# Patient Record
Sex: Male | Born: 1970 | Race: Black or African American | Hispanic: No | Marital: Single | Smoking: Current every day smoker
Health system: Southern US, Community
[De-identification: ages and names within clinical notes are randomized; demographics above are authoritative.]

## PROBLEM LIST (undated history)

## (undated) DIAGNOSIS — F99 Mental disorder, not otherwise specified: Secondary | ICD-10-CM

## (undated) DIAGNOSIS — F1910 Other psychoactive substance abuse, uncomplicated: Secondary | ICD-10-CM

## (undated) HISTORY — PX: DENTAL SURGERY: SHX609

---

## 2011-04-14 ENCOUNTER — Emergency Department (HOSPITAL_COMMUNITY)
Admission: EM | Admit: 2011-04-14 | Discharge: 2011-04-14 | Disposition: A | Discharge status: Home / Self Care | Payer: Medicare Other | Attending: Emergency Medicine | Admitting: Emergency Medicine

## 2011-04-14 ENCOUNTER — Encounter: Payer: Self-pay | Admitting: *Deleted

## 2011-04-14 DIAGNOSIS — M545 Low back pain, unspecified: Secondary | ICD-10-CM | POA: Insufficient documentation

## 2011-04-14 DIAGNOSIS — L03319 Cellulitis of trunk, unspecified: Secondary | ICD-10-CM | POA: Insufficient documentation

## 2011-04-14 DIAGNOSIS — F17200 Nicotine dependence, unspecified, uncomplicated: Secondary | ICD-10-CM | POA: Insufficient documentation

## 2011-04-14 DIAGNOSIS — Z79899 Other long term (current) drug therapy: Secondary | ICD-10-CM | POA: Insufficient documentation

## 2011-04-14 DIAGNOSIS — L02219 Cutaneous abscess of trunk, unspecified: Secondary | ICD-10-CM | POA: Insufficient documentation

## 2011-04-14 DIAGNOSIS — L0291 Cutaneous abscess, unspecified: Secondary | ICD-10-CM

## 2011-04-14 HISTORY — DX: Other psychoactive substance abuse, uncomplicated: F19.10

## 2011-04-14 HISTORY — DX: Mental disorder, not otherwise specified: F99

## 2011-04-14 NOTE — ED Provider Notes (Signed)
History     CSN: [id number]  Arrival date & time 04/14/11  1435   First MD Initiated Contact with Patient 04/14/11 1753      Chief Complaint  Patient presents with  . Mass    L hip mass    (Consider location/radiation/quality/duration/timing/severity/associated sxs/prior treatment) HPI Comments: Abscess: Patient presents for evaluation of a cutaneous abscess. Lesion is located in the left lower back . Onset was 2 days ago. Symptoms have stabilized. Abscess has associated symptoms of pain. Patient does not have previous history of cutaneous abscesses. Patient does not have diabetes.    Patient is a 41 y.o. male presenting with abscess. The history is provided by the patient.  Abscess  This is a new problem. The current episode started less than one week ago. The problem has been gradually worsening. Affected Location: LL back  The problem is mild. Pertinent negatives include no anorexia, no decrease in physical activity, not sleeping less, not drinking less, no fever, no fussiness, not sleeping more, no diarrhea, no vomiting, no congestion, no rhinorrhea, no sore throat, no decreased responsiveness and no cough. His past medical history does not include skin abscesses in family. There were no sick contacts.    Past Medical History  Diagnosis Date  . Psychiatric illness     Schitzophrenia  . Substance abuse     Past Surgical History  Procedure Date  . Dental surgery     Family History  Problem Relation Age of Onset  . Diabetes Mother   . Hypertension Mother     History  Substance Use Topics  . Smoking status: Current Everyday Smoker -- 0.5 packs/day  . Smokeless tobacco: Never Used  . Alcohol Use: No     Sober for 5 months      Review of Systems  Constitutional: Negative for fever, chills, diaphoresis, activity change and decreased responsiveness.       Denies night sweats  HENT: Negative for congestion, sore throat, rhinorrhea and neck stiffness.   Eyes:  Negative for visual disturbance.  Respiratory: Negative for cough and shortness of breath.   Cardiovascular: Negative for chest pain.  Gastrointestinal: Negative for vomiting, abdominal pain, diarrhea and anorexia.  Genitourinary: Negative for dysuria, urgency and frequency.  Musculoskeletal: Negative for gait problem.  Skin: Negative for color change and rash.  Neurological: Negative for dizziness, light-headedness and headaches.  Hematological: Negative for adenopathy.  All other systems reviewed and are negative.    Allergies  Review of patient's allergies indicates not on file.  Home Medications   Current Outpatient Rx  Name Route Sig Dispense Refill  . BENZTROPINE MESYLATE 1 MG PO TABS Oral Take 1 mg by mouth 2 (two) times daily.      Marland Kitchen CITALOPRAM HYDROBROMIDE 20 MG PO TABS Oral Take 20 mg by mouth daily.      Marland Kitchen FLUPHENAZINE HCL 5 MG PO TABS Oral Take 5 mg by mouth daily.      . TRAZODONE HCL 50 MG PO TABS Oral Take 50 mg by mouth at bedtime.        BP 114/59  Pulse 63  Temp(Src) 98.7 F (37.1 C) (Oral)  Resp 16  Wt 148 lb (67.132 kg)  SpO2 97%  Physical Exam  Nursing note and vitals reviewed. Constitutional: He is oriented to person, place, and time. Vital signs are normal. He appears well-developed and well-nourished. He does not appear ill. No distress.  HENT:  Head: Normocephalic and atraumatic.  Eyes: EOM are normal. Pupils  are equal, round, and reactive to light.  Neck: Normal range of motion. Neck supple.  Cardiovascular: Normal rate and regular rhythm.   Pulmonary/Chest: Effort normal.  Musculoskeletal: He exhibits no edema.  Lymphadenopathy:       Head (right side): No submental, no preauricular and no posterior auricular adenopathy present.       Head (left side): No submental, no submandibular, no preauricular and no posterior auricular adenopathy present.    He has no cervical adenopathy.    He has no axillary adenopathy.  Neurological: He is alert  and oriented to person, place, and time.  Skin: Skin is warm and dry. No rash noted. He is not diaphoretic.          LLBack abscess 4x5cm, fluctuant, mild erythema, no warmth, not currently draining, tender to palpation.    ED Course  Procedures (including critical care time) INCISION AND DRAINAGE Performed by: Jaci Carrel Consent: Verbal consent obtained. Risks and benefits: risks, benefits and alternatives were discussed Type: abscess  Body area: left lower back  Anesthesia: local infiltration  Local anesthetic: lidocaine 1% with  epinephrine  Anesthetic total: 2 ml  Complexity: complex Blunt dissection to break up loculations  Drainage: purulent  Drainage amount: moderate  Packing material: 1/4 in iodoform gauze  Patient tolerance: Patient tolerated the procedure well with no immediate complications.    Labs Reviewed - No data to display No results found.   No diagnosis found.    MDM  Abscess        Jaci Carrel, [place] 04/14/11 1610

## 2011-04-14 NOTE — ED Notes (Signed)
Pt reports "a knot" to L hip/lower back area "from a Prolixin injection", area has been there for about a year but has gotten bigger with increase in pain over the last 2 weeks.

## 2011-04-15 NOTE — ED Provider Notes (Signed)
Medical screening examination/treatment/procedure(s) were performed by non-physician practitioner and as supervising physician I was immediately available for consultation/collaboration.  Tynslee Bowlds P Kalup Jaquith, MD 04/15/11 0024 

## 2013-11-10 ENCOUNTER — Encounter (HOSPITAL_BASED_OUTPATIENT_CLINIC_OR_DEPARTMENT_OTHER): Payer: Self-pay | Admitting: Emergency Medicine

## 2013-11-10 ENCOUNTER — Emergency Department (HOSPITAL_BASED_OUTPATIENT_CLINIC_OR_DEPARTMENT_OTHER)
Admission: EM | Admit: 2013-11-10 | Discharge: 2013-11-10 | Disposition: A | Discharge status: Home / Self Care | Payer: Medicare Other | Attending: Emergency Medicine | Admitting: Emergency Medicine

## 2013-11-10 DIAGNOSIS — Y9389 Activity, other specified: Secondary | ICD-10-CM | POA: Insufficient documentation

## 2013-11-10 DIAGNOSIS — S6992XA Unspecified injury of left wrist, hand and finger(s), initial encounter: Secondary | ICD-10-CM

## 2013-11-10 DIAGNOSIS — Y929 Unspecified place or not applicable: Secondary | ICD-10-CM | POA: Insufficient documentation

## 2013-11-10 DIAGNOSIS — W230XXA Caught, crushed, jammed, or pinched between moving objects, initial encounter: Secondary | ICD-10-CM | POA: Insufficient documentation

## 2013-11-10 DIAGNOSIS — S6980XA Other specified injuries of unspecified wrist, hand and finger(s), initial encounter: Secondary | ICD-10-CM | POA: Insufficient documentation

## 2013-11-10 DIAGNOSIS — S6990XA Unspecified injury of unspecified wrist, hand and finger(s), initial encounter: Secondary | ICD-10-CM | POA: Insufficient documentation

## 2013-11-10 DIAGNOSIS — Z79899 Other long term (current) drug therapy: Secondary | ICD-10-CM | POA: Insufficient documentation

## 2013-11-10 DIAGNOSIS — F17200 Nicotine dependence, unspecified, uncomplicated: Secondary | ICD-10-CM | POA: Insufficient documentation

## 2013-11-10 DIAGNOSIS — Z8659 Personal history of other mental and behavioral disorders: Secondary | ICD-10-CM | POA: Insufficient documentation

## 2013-11-10 MED ORDER — OXYCODONE-ACETAMINOPHEN 5-325 MG PO TABS
1.0000 | ORAL_TABLET | Freq: Once | ORAL | Status: AC
  Administered 2013-11-10: 1 via ORAL
  Filled 2013-11-10: qty 1

## 2013-11-10 MED ORDER — OXYCODONE-ACETAMINOPHEN 5-325 MG PO TABS: 1.0000 | ORAL_TABLET | Freq: Four times a day (QID) | ORAL | Status: DC | PRN

## 2013-11-10 MED ORDER — SULFAMETHOXAZOLE-TMP DS 800-160 MG PO TABS
1.0000 | ORAL_TABLET | Freq: Once | ORAL | Status: AC
Start: 2013-11-10 — End: 2013-11-10
  Administered 2013-11-10: 1 via ORAL
  Filled 2013-11-10: qty 1

## 2013-11-10 MED ORDER — SULFAMETHOXAZOLE-TMP DS 800-160 MG PO TABS: 1.0000 | ORAL_TABLET | Freq: Two times a day (BID) | ORAL | Status: DC

## 2013-11-10 NOTE — Discharge Instructions (Signed)
Return to the ED with any concerns including increased pain or swelling in finger, pus draining, redness of finger, or any other alarming symptoms  You should return to the ED for a recheck in the next 48 hours

## 2013-11-10 NOTE — ED Notes (Addendum)
Pt reports he put a ring on his finger 3 days ago, 2 days ago started having pain/swelling in his finger. Pt reports [facility] went to [facility] and left after being seen this morning and they were unable to get the ring off.

## 2013-11-10 NOTE — ED Notes (Signed)
Contact for patient Scott Jenkins [phone]

## 2013-11-10 NOTE — ED Provider Notes (Signed)
CSN: [id number]     Arrival date & time 11/10/13  1443 History   First MD Initiated Contact with Patient 11/10/13 1516     Chief Complaint  Patient presents with  . Hand Pain     (Consider location/radiation/quality/duration/timing/severity/associated sxs/prior Treatment) HPI Pt presenting with c/o not being able to get rings off his left middle finger.  He states the rings were put on 3 days ago and 2 days ago he began to notice discoloration and swelling of his finger.  He states finger is painful.  He states he was at [facility] earlier today and left before they were able to remove the rings.  Pain is constant.  There are no other associated systemic symptoms, there are no other alleviating or modifying factors.   Past Medical History  Diagnosis Date  . Psychiatric illness     Schitzophrenia  . Substance abuse    Past Surgical History  Procedure Laterality Date  . Dental surgery     Family History  Problem Relation Age of Onset  . Diabetes Mother   . Hypertension Mother    History  Substance Use Topics  . Smoking status: Current Every Day Smoker -- 0.50 packs/day  . Smokeless tobacco: Never Used  . Alcohol Use: Yes     Comment: Sober for 5 months    Review of Systems ROS reviewed and all otherwise negative except for mentioned in HPI    Allergies  Review of patient's allergies indicates not on file.  Home Medications   Prior to Admission medications   Medication Sig Start Date End Date Taking? Authorizing Provider  benztropine (COGENTIN) 1 MG tablet Take 1 mg by mouth 2 (two) times daily.      Historical Provider, MD  citalopram (CELEXA) 20 MG tablet Take 20 mg by mouth daily.      Historical Provider, MD  fluPHENAZine (PROLIXIN) 5 MG tablet Take 5 mg by mouth daily.      Historical Provider, MD  oxyCODONE-acetaminophen (PERCOCET/ROXICET) 5-325 MG per tablet Take 1-2 tablets by mouth every 6 (six) hours as needed for severe pain. 11/10/13   Ethelda ChickMartha K  Linker, MD  sulfamethoxazole-trimethoprim (BACTRIM DS) 800-160 MG per tablet Take 1 tablet by mouth 2 (two) times daily. 11/10/13   Ethelda ChickMartha K Linker, MD  traZODone (DESYREL) 50 MG tablet Take 50 mg by mouth at bedtime.      Historical Provider, MD   BP 141/88  Pulse 54  Temp(Src) 97.9 F (36.6 C) (Oral)  Resp 18  Ht 5\' 8"  (1.727 m)  Wt 142 lb (64.411 kg)  BMI 21.60 kg/m2  SpO2 98% Vitals reviewed Physical Exam Physical Examination: General appearance - alert, well appearing, and in no distress Mental status - alert, oriented to person, place, and time Eyes -  No conjunctival injection, no scleral icterus Chest - clear to auscultation, no wheezes, rales or rhonchi, symmetric air entry Heart - normal rate, regular rhythm, normal S1, S2, no murmurs, rubs, clicks or gallops Musculoskeletal - no joint tenderness, deformity or swelling Extremities - peripheral pulses normal, no pedal edema, no clubbing or cyanosis Skin - left 3rd finger with swelling and erythema distal to multiple thick rings- rings appear embedded in skin with some skin breakdown around ring edges, otherwise normal coloration and turgor, no rashes  ED Course  Procedures (including critical care time) Labs Review Labs Reviewed - No data to display  Imaging Review No results found.   EKG Interpretation None  MDM   Final diagnoses:  Finger injury, left, initial encounter    3 rings stuck on patients left 3rd finger- some skin erosion and breakdown associated. Rings removed with ring cutter afer digital block as patient could not tolerate procedure.  Will place patient on antibiotics to cover for possible infection although no active infection seen at this time.  It appears rings were on longer than the 3 days.  Will have patient followup in 2 days for a recheck.      Ethelda Chick, MD 11/12/13 [phone]

## 2014-04-13 DIAGNOSIS — F209 Schizophrenia, unspecified: Secondary | ICD-10-CM | POA: Diagnosis not present

## 2014-04-13 DIAGNOSIS — F251 Schizoaffective disorder, depressive type: Secondary | ICD-10-CM | POA: Diagnosis not present

## 2014-04-27 DIAGNOSIS — F209 Schizophrenia, unspecified: Secondary | ICD-10-CM | POA: Diagnosis not present

## 2014-04-27 DIAGNOSIS — F251 Schizoaffective disorder, depressive type: Secondary | ICD-10-CM | POA: Diagnosis not present

## 2014-05-11 DIAGNOSIS — F209 Schizophrenia, unspecified: Secondary | ICD-10-CM | POA: Diagnosis not present

## 2014-05-11 DIAGNOSIS — F251 Schizoaffective disorder, depressive type: Secondary | ICD-10-CM | POA: Diagnosis not present

## 2014-05-25 DIAGNOSIS — F251 Schizoaffective disorder, depressive type: Secondary | ICD-10-CM | POA: Diagnosis not present

## 2014-05-25 DIAGNOSIS — F209 Schizophrenia, unspecified: Secondary | ICD-10-CM | POA: Diagnosis not present

## 2014-05-28 DIAGNOSIS — F251 Schizoaffective disorder, depressive type: Secondary | ICD-10-CM | POA: Diagnosis not present

## 2014-05-31 DIAGNOSIS — F251 Schizoaffective disorder, depressive type: Secondary | ICD-10-CM | POA: Diagnosis not present

## 2014-06-08 DIAGNOSIS — F209 Schizophrenia, unspecified: Secondary | ICD-10-CM | POA: Diagnosis not present

## 2014-06-08 DIAGNOSIS — F251 Schizoaffective disorder, depressive type: Secondary | ICD-10-CM | POA: Diagnosis not present

## 2014-06-09 DIAGNOSIS — F251 Schizoaffective disorder, depressive type: Secondary | ICD-10-CM | POA: Diagnosis not present

## 2014-06-15 DIAGNOSIS — F209 Schizophrenia, unspecified: Secondary | ICD-10-CM | POA: Diagnosis not present

## 2014-06-15 DIAGNOSIS — F251 Schizoaffective disorder, depressive type: Secondary | ICD-10-CM | POA: Diagnosis not present

## 2014-06-22 DIAGNOSIS — F209 Schizophrenia, unspecified: Secondary | ICD-10-CM | POA: Diagnosis not present

## 2014-06-22 DIAGNOSIS — F251 Schizoaffective disorder, depressive type: Secondary | ICD-10-CM | POA: Diagnosis not present

## 2014-06-23 DIAGNOSIS — F251 Schizoaffective disorder, depressive type: Secondary | ICD-10-CM | POA: Diagnosis not present

## 2014-07-06 DIAGNOSIS — F251 Schizoaffective disorder, depressive type: Secondary | ICD-10-CM | POA: Diagnosis not present

## 2014-07-06 DIAGNOSIS — F209 Schizophrenia, unspecified: Secondary | ICD-10-CM | POA: Diagnosis not present

## 2014-07-08 DIAGNOSIS — F251 Schizoaffective disorder, depressive type: Secondary | ICD-10-CM | POA: Diagnosis not present

## 2014-07-20 DIAGNOSIS — F251 Schizoaffective disorder, depressive type: Secondary | ICD-10-CM | POA: Diagnosis not present

## 2014-07-20 DIAGNOSIS — F209 Schizophrenia, unspecified: Secondary | ICD-10-CM | POA: Diagnosis not present

## 2014-08-03 DIAGNOSIS — F209 Schizophrenia, unspecified: Secondary | ICD-10-CM | POA: Diagnosis not present

## 2014-08-03 DIAGNOSIS — F251 Schizoaffective disorder, depressive type: Secondary | ICD-10-CM | POA: Diagnosis not present

## 2014-08-17 DIAGNOSIS — F251 Schizoaffective disorder, depressive type: Secondary | ICD-10-CM | POA: Diagnosis not present

## 2014-08-21 ENCOUNTER — Emergency Department (HOSPITAL_COMMUNITY)
Admission: EM | Admit: 2014-08-21 | Discharge: 2014-08-21 | Disposition: A | Discharge status: Home / Self Care | Payer: Medicare Other | Attending: Emergency Medicine | Admitting: Emergency Medicine

## 2014-08-21 ENCOUNTER — Encounter (HOSPITAL_COMMUNITY): Payer: Self-pay | Admitting: *Deleted

## 2014-08-21 ENCOUNTER — Emergency Department (HOSPITAL_COMMUNITY): Payer: Medicare Other

## 2014-08-21 DIAGNOSIS — R0789 Other chest pain: Secondary | ICD-10-CM | POA: Insufficient documentation

## 2014-08-21 DIAGNOSIS — Z8659 Personal history of other mental and behavioral disorders: Secondary | ICD-10-CM | POA: Diagnosis not present

## 2014-08-21 DIAGNOSIS — Z79899 Other long term (current) drug therapy: Secondary | ICD-10-CM | POA: Diagnosis not present

## 2014-08-21 DIAGNOSIS — R079 Chest pain, unspecified: Secondary | ICD-10-CM | POA: Diagnosis not present

## 2014-08-21 DIAGNOSIS — Z720 Tobacco use: Secondary | ICD-10-CM | POA: Insufficient documentation

## 2014-08-21 LAB — I-STAT TROPONIN, ED: TROPONIN I, POC: 0 ng/mL (ref 0.00–0.08)

## 2014-08-21 LAB — CBC WITH DIFFERENTIAL/PLATELET
BASOS ABS: 0.1 10*3/uL (ref 0.0–0.1)
Basophils Relative: 1 % (ref 0–1)
Eosinophils Absolute: 0.2 10*3/uL (ref 0.0–0.7)
Eosinophils Relative: 3 % (ref 0–5)
HCT: 40.1 % (ref 39.0–52.0)
Hemoglobin: 13.1 g/dL (ref 13.0–17.0)
LYMPHS ABS: 2.3 10*3/uL (ref 0.7–4.0)
Lymphocytes Relative: 37 % (ref 12–46)
MCH: 30.5 pg (ref 26.0–34.0)
MCHC: 32.7 g/dL (ref 30.0–36.0)
MCV: 93.5 fL (ref 78.0–100.0)
Monocytes Absolute: 0.7 10*3/uL (ref 0.1–1.0)
Monocytes Relative: 12 % (ref 3–12)
NEUTROS ABS: 3 10*3/uL (ref 1.7–7.7)
NEUTROS PCT: 47 % (ref 43–77)
Platelets: 277 10*3/uL (ref 150–400)
RBC: 4.29 MIL/uL (ref 4.22–5.81)
RDW: 12.5 % (ref 11.5–15.5)
WBC: 6.3 10*3/uL (ref 4.0–10.5)

## 2014-08-21 LAB — I-STAT CHEM 8, ED
BUN: 15 mg/dL (ref 6–20)
CALCIUM ION: 1.25 mmol/L — AB (ref 1.12–1.23)
CREATININE: 0.9 mg/dL (ref 0.61–1.24)
Chloride: 100 mmol/L — ABNORMAL LOW (ref 101–111)
Glucose, Bld: 98 mg/dL (ref 65–99)
HCT: 42 % (ref 39.0–52.0)
HEMOGLOBIN: 14.3 g/dL (ref 13.0–17.0)
Potassium: 4 mmol/L (ref 3.5–5.1)
SODIUM: 141 mmol/L (ref 135–145)
TCO2: 25 mmol/L (ref 0–100)

## 2014-08-21 MED ORDER — NAPROXEN 500 MG PO TABS: 500.0000 mg | ORAL_TABLET | Freq: Two times a day (BID) | ORAL | Status: AC

## 2014-08-21 MED ORDER — METHOCARBAMOL 500 MG PO TABS: 500.0000 mg | ORAL_TABLET | Freq: Two times a day (BID) | ORAL | Status: AC

## 2014-08-21 NOTE — ED Notes (Signed)
Pt reports R sided non radiating chest pain and SOB. Ongoing x2 days. 8/10 pain, increases with movement and deep breathing. Respirations unlabored. Lung sounds clear and equal bilaterally. Skin warm and dry. Pt is alert and oriented x4.

## 2014-08-21 NOTE — Discharge Instructions (Signed)

## 2014-08-21 NOTE — ED Provider Notes (Signed)
CSN: [id number]     Arrival date & time 08/21/14  [id number] History   First MD Initiated Contact with Patient 08/21/14 0820     Chief Complaint  Patient presents with  . Chest Pain     (Consider location/radiation/quality/duration/timing/severity/associated sxs/prior Treatment) HPI   44 year old male with history of schizophrenia and polysubstance abuse who presents for evaluation of chest pain. Patient reports for the past 2-3 days he has had intermittent chest discomfort. He described pain as a sharp sensation to his mid sternum that radiates to his right lower chest. Endorse pleuritic pain worsened with breathing and with certain movement. Pain usually lasting for 20-30 minutes with associated shortness of breath and dizziness. 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 Patient denies fever, chills, severe headache, cough, hemoptysis, abdominal pain, nausea, vomiting, diaphoresis. He denies any recent chest injury or strenuous activities aside from his usual work. Patient mentioned that he has been clean from substance abuse for the past 6 months and denies any cocaine use. Denies any prior history of PE or DVT, no recent surgery, prolonged bed rest, unilateral leg swelling or calf pain. Patient reports his pain is minimal at this time.   Past Medical History  Diagnosis Date  . Psychiatric illness     Schitzophrenia  . Substance abuse    Past Surgical History  Procedure Laterality Date  . Dental surgery     Family History  Problem Relation Age of Onset  . Diabetes Mother   . Hypertension Mother    History  Substance Use Topics  . Smoking status: Current Every Day Smoker -- 0.50 packs/day  . Smokeless tobacco: Never Used  . Alcohol Use: Yes     Comment: Sober for 5 months    Review of Systems  All other systems reviewed and are negative.     Allergies  Review of patient's allergies indicates no known allergies.  Home Medications   Prior to  Admission medications   Medication Sig Start Date End Date Taking? Authorizing Provider  benztropine (COGENTIN) 1 MG tablet Take 1 mg by mouth 2 (two) times daily.      Historical Provider, MD  citalopram (CELEXA) 20 MG tablet Take 20 mg by mouth daily.      Historical Provider, MD  fluPHENAZine (PROLIXIN) 5 MG tablet Take 5 mg by mouth daily.      Historical Provider, MD  oxyCODONE-acetaminophen (PERCOCET/ROXICET) 5-325 MG per tablet Take 1-2 tablets by mouth every 6 (six) hours as needed for severe pain. 11/10/13   Jerelyn ScottMartha Linker, MD  sulfamethoxazole-trimethoprim (BACTRIM DS) 800-160 MG per tablet Take 1 tablet by mouth 2 (two) times daily. 11/10/13   Jerelyn ScottMartha Linker, MD  traZODone (DESYREL) 50 MG tablet Take 50 mg by mouth at bedtime.      Historical Provider, MD   There were no vitals taken for this visit. Physical Exam  Constitutional: He appears well-developed and well-nourished. No distress.  Awake, alert, nontoxic appearance  HENT:  Head: Atraumatic.  Eyes: Conjunctivae are normal. Right eye exhibits no discharge. Left eye exhibits no discharge.  Neck: Normal range of motion. Neck supple.  Cardiovascular: Normal rate, regular rhythm and intact distal pulses.   Pulmonary/Chest: Effort normal. No respiratory distress. He exhibits tenderness (mild tenderness to right anterior lateral chest wall on palpation without overlying skin changes, no crepitus or emphysema.).  Abdominal: Soft. There is no tenderness. There is no rebound.  Musculoskeletal: He exhibits no edema or tenderness.  Neurological: He  is alert.  Skin: Skin is warm and dry. No rash noted.  Psychiatric: He has a normal mood and affect.  Nursing note and vitals reviewed.   ED Course  Procedures (including critical care time)  Patient here with sharp intermittent chest pain. Pain is atypical for ACS. PERC negative low suspicion for PE. 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  10:25 AM HEART score of 1, low risk for MACE.  ECG, CXR, labs are reassuring.  Pt without active CP, only reproducible CP.  Will provide sxs treatment.  outpt f/u recommended.  Return precaution discussed.    Labs Review Labs Reviewed  I-STAT CHEM 8, ED - Abnormal; Notable for the following:    Chloride 100 (*)    Calcium, Ion 1.25 (*)    All other components within normal limits  CBC WITH DIFFERENTIAL/PLATELET  Rosezena SensorI-STAT TROPOININ, ED    Imaging Review Dg Chest 2 View  08/21/2014   CLINICAL DATA:  Acute chest pain.  EXAM: CHEST  2 VIEW  COMPARISON:  None.  FINDINGS: The heart size and mediastinal contours are within normal limits. Both lungs are clear. No pneumothorax or pleural effusion is noted. The visualized skeletal structures are unremarkable.  IMPRESSION: No active cardiopulmonary disease.   Electronically Signed   By: Lupita RaiderJames  Green Jr, M.D.   On: 08/21/2014 10:18     EKG Interpretation None      Date: 08/21/2014  Rate: 65  Rhythm: normal sinus rhythm  QRS Axis: rightward axis  Intervals: normal  ST/T Wave abnormalities: normal  Conduction Disutrbances: none  Narrative Interpretation:   Old EKG Reviewed: No significant changes noted     MDM   Final diagnoses:  Chest pain    BP 128/75 mmHg  Pulse 60  Temp(Src) 97.8 F (36.6 C) (Oral)  Resp 18  SpO2 98%  I have reviewed nursing notes and vital signs. I personally reviewed the imaging tests through PACS system  I reviewed available ER/hospitalization records thought the EMR     Fayrene HelperBowie Gwendoline Judy, PA-C 08/21/14 1034  Fayrene HelperBowie Hosey Burmester, PA-C 08/21/14 1035  Gilda Creasehristopher J Pollina, MD 08/21/14 1036

## 2014-08-21 NOTE — ED Notes (Signed)
Pt reports right side chest pains and sob for several days.

## 2014-08-27 ENCOUNTER — Emergency Department (INDEPENDENT_AMBULATORY_CARE_PROVIDER_SITE_OTHER)
Admission: EM | Admit: 2014-08-27 | Discharge: 2014-08-27 | Disposition: A | Discharge status: Home / Self Care | Payer: Self-pay | Source: Home / Self Care | Attending: Family Medicine | Admitting: Family Medicine

## 2014-08-27 ENCOUNTER — Encounter (HOSPITAL_COMMUNITY): Payer: Self-pay | Admitting: Emergency Medicine

## 2014-08-27 DIAGNOSIS — K088 Other specified disorders of teeth and supporting structures: Secondary | ICD-10-CM

## 2014-08-27 DIAGNOSIS — K047 Periapical abscess without sinus: Secondary | ICD-10-CM

## 2014-08-27 DIAGNOSIS — K0889 Other specified disorders of teeth and supporting structures: Secondary | ICD-10-CM

## 2014-08-27 MED ORDER — DICLOFENAC SODIUM 75 MG PO TBEC: 75.0000 mg | DELAYED_RELEASE_TABLET | Freq: Two times a day (BID) | ORAL | Status: AC | PRN

## 2014-08-27 MED ORDER — AMOXICILLIN 500 MG PO CAPS: 500.0000 mg | ORAL_CAPSULE | Freq: Three times a day (TID) | ORAL | Status: AC

## 2014-08-27 NOTE — ED Notes (Signed)
Pt reports an abscess on his upper front gum that he has had for about two weeks that is getting progressively bigger and more painful.

## 2014-08-27 NOTE — ED Provider Notes (Signed)
Scott Jenkins is a 44 y.o. male who presents to Urgent Care today for tooth pain. Patient has left upper frontal tooth pain present for the last 2 weeks. He denies any injury. He notes pain without fevers chills nausea vomiting or diarrhea. He has not tried anything yet. He has not attempted to contact a dentist yet. [facility]    Past Medical History  Diagnosis Date  . Psychiatric illness     Schitzophrenia  . Substance abuse    Past Surgical History  Procedure Laterality Date  . Dental surgery     History  Substance Use Topics  . Smoking status: Current Every Day Smoker -- 0.50 packs/day  . Smokeless tobacco: Never Used  . Alcohol Use: Yes     Comment: Sober for 5 months   ROS as above Medications: No current facility-administered medications for this encounter.   Current Outpatient Prescriptions  Medication Sig Dispense Refill  . benztropine (COGENTIN) 1 MG tablet Take 1 mg by mouth daily.     . Divalproex Sodium (DEPAKOTE PO) Take 1 tablet by mouth at bedtime.    . fluPHENAZine decanoate (PROLIXIN) 25 MG/ML injection Inject 12.5 mg into the muscle every 14 (fourteen) days.    Marland Kitchen. QUEtiapine Fumarate (SEROQUEL PO) Take by mouth.    Marland Kitchen. amoxicillin (AMOXIL) 500 MG capsule Take 1 capsule (500 mg total) by mouth 3 (three) times daily. 30 capsule 0  . citalopram (CELEXA) 20 MG tablet Take 20 mg by mouth daily.      . diclofenac (VOLTAREN) 75 MG EC tablet Take 1 tablet (75 mg total) by mouth 2 (two) times daily as needed. 30 tablet 0  . methocarbamol (ROBAXIN) 500 MG tablet Take 1 tablet (500 mg total) by mouth 2 (two) times daily. 20 tablet 0  . naproxen (NAPROSYN) 500 MG tablet Take 1 tablet (500 mg total) by mouth 2 (two) times daily. 30 tablet 0   No Known Allergies   Exam:  BP 149/88 mmHg  Pulse 58  Temp(Src) 98.3 F (36.8 C) (Oral)  SpO2 98% Gen: Well NAD HEENT: EOMI,  MMM Tender erythematous area above left frontal incisor. Incisor  slightly loose and eroded at the gumline. No fluctuance.  Lungs: Normal work of breathing. CTABL Heart: RRR no MRG Abd: NABS, Soft. Nondistended, Nontender Exts: Brisk capillary refill, warm and well perfused.   No results found for this or any previous visit (from the past 24 hour(s)). No results found.  Assessment and Plan: 44 y.o. male with dental pain. Likely due to dental infection. Treat with amoxicillin and diclofenac. Follow up with a dentist ASAP.  Discussed warning signs or symptoms. Please see discharge instructions. Patient expresses understanding.     Rodolph BongEvan S Adriane Guglielmo, MD 08/27/14 [phone]

## 2014-08-27 NOTE — Discharge Instructions (Signed)
Thank you for coming in today.  Dental Abscess A dental abscess is a collection of infected fluid (pus) from a bacterial infection in the inner part of the tooth (pulp). It usually occurs at the end of the tooth's root.  CAUSES   Severe tooth decay.  Trauma to the tooth that allows bacteria to enter into the pulp, such as a broken or chipped tooth. SYMPTOMS   Severe pain in and around the infected tooth.  Swelling and redness around the abscessed tooth or in the mouth or face.  Tenderness.  Pus drainage.  Bad breath.  Bitter taste in the mouth.  Difficulty swallowing.  Difficulty opening the mouth.  Nausea.  Vomiting.  Chills.  Swollen neck glands. DIAGNOSIS   A medical and dental history will be taken.  An examination will be performed by tapping on the abscessed tooth.  X-rays may be taken of the tooth to identify the abscess. TREATMENT The goal of treatment is to eliminate the infection. You may be prescribed antibiotic medicine to stop the infection from spreading. A root canal may be performed to save the tooth. If the tooth cannot be saved, it may be pulled (extracted) and the abscess may be drained.  HOME CARE INSTRUCTIONS  Only take over-the-counter or prescription medicines for pain, fever, or discomfort as directed by your caregiver.  Rinse your mouth (gargle) often with salt water ( tsp salt in 8 oz [250 ml] of warm water) to relieve pain or swelling.  Do not drive after taking pain medicine (narcotics).  Do not apply heat to the outside of your face.  Return to your dentist for further treatment as directed. SEEK MEDICAL CARE IF:  Your pain is not helped by medicine.  Your pain is getting worse instead of better. SEEK IMMEDIATE MEDICAL CARE IF:  You have a fever or persistent symptoms for more than 2-3 days.  You have a fever and your symptoms suddenly get worse.  You have chills or a very bad headache.  You have problems breathing or  swallowing.  You have trouble opening your mouth.  You have swelling in the neck or around the eye. Document Released: 03/26/2005 Document Revised: 12/19/2011 Document Reviewed: 07/04/2010 [facility] Patient Information 2015 [employer] [place]. This information is not intended to replace advice given to you by your health care provider. Make sure you discuss any questions you have with your health care provider.  [facility]:  [facility] [phone]- [phone] (ext [phone])  [phone]  Please call Dr. Lawrence Marseillesivils office [phone] or cell [phone] [address], [place]  Cost for tooth removal $200 includes exam, Xray, and extraction and follow up visit.  Bring list of current medications with you.   [facility] - [phone]  [facility] [phone]- [phone]  [address]  [facility]  [address], [place], [place], [address]  [phone], Ext. [phone]  2nd and 4th Thursday of the month at 6:30am (Simple extractions only - no wisdom teeth or surgery) First come/First serve -First 10 clients served  [facility] [place] residents only)  [address], [place], [place], [address]  336 [phone]  [facility]  336 [phone]  [facility]  [phone]  [facility]  [phone]  Please call [facility] at [phone] to get the details to get your tooth pulled.

## 2014-09-01 DIAGNOSIS — F251 Schizoaffective disorder, depressive type: Secondary | ICD-10-CM | POA: Diagnosis not present

## 2014-09-01 DIAGNOSIS — F209 Schizophrenia, unspecified: Secondary | ICD-10-CM | POA: Diagnosis not present

## 2014-09-13 DIAGNOSIS — F209 Schizophrenia, unspecified: Secondary | ICD-10-CM | POA: Diagnosis not present

## 2014-09-13 DIAGNOSIS — F251 Schizoaffective disorder, depressive type: Secondary | ICD-10-CM | POA: Diagnosis not present

## 2014-09-14 DIAGNOSIS — F251 Schizoaffective disorder, depressive type: Secondary | ICD-10-CM | POA: Diagnosis not present

## 2014-09-14 DIAGNOSIS — F209 Schizophrenia, unspecified: Secondary | ICD-10-CM | POA: Diagnosis not present

## 2014-09-28 DIAGNOSIS — F251 Schizoaffective disorder, depressive type: Secondary | ICD-10-CM | POA: Diagnosis not present

## 2014-09-28 DIAGNOSIS — F209 Schizophrenia, unspecified: Secondary | ICD-10-CM | POA: Diagnosis not present

## 2014-10-12 DIAGNOSIS — F251 Schizoaffective disorder, depressive type: Secondary | ICD-10-CM | POA: Diagnosis not present

## 2014-10-12 DIAGNOSIS — F209 Schizophrenia, unspecified: Secondary | ICD-10-CM | POA: Diagnosis not present

## 2014-10-19 DIAGNOSIS — F251 Schizoaffective disorder, depressive type: Secondary | ICD-10-CM | POA: Diagnosis not present

## 2014-10-29 ENCOUNTER — Emergency Department (INDEPENDENT_AMBULATORY_CARE_PROVIDER_SITE_OTHER)
Admission: EM | Admit: 2014-10-29 | Discharge: 2014-10-29 | Disposition: A | Discharge status: Home / Self Care | Payer: Self-pay | Source: Home / Self Care | Attending: Family Medicine | Admitting: Family Medicine

## 2014-10-29 ENCOUNTER — Encounter (HOSPITAL_COMMUNITY): Payer: Self-pay | Admitting: Emergency Medicine

## 2014-10-29 DIAGNOSIS — F69 Unspecified disorder of adult personality and behavior: Secondary | ICD-10-CM

## 2014-10-29 DIAGNOSIS — T887XXA Unspecified adverse effect of drug or medicament, initial encounter: Secondary | ICD-10-CM

## 2014-10-29 DIAGNOSIS — F99 Mental disorder, not otherwise specified: Secondary | ICD-10-CM

## 2014-10-29 DIAGNOSIS — T50905A Adverse effect of unspecified drugs, medicaments and biological substances, initial encounter: Secondary | ICD-10-CM

## 2014-10-29 NOTE — ED Notes (Signed)
Patient concerned for feeling like he is shaking, reports uncontrollable twitching.  Patient reports he did not take depakote or seraquel  For 2 days, just too tired, not out of medication.  Patient also reports prolixen was every 2 weeks, but now every 3 weeks.

## 2014-10-29 NOTE — Discharge Instructions (Signed)
Continue to take your medications as directed. Keep appointment with your doctor on Tuesday , 4 days. For worsening go to the [facility] ED promptly.

## 2014-10-29 NOTE — ED Provider Notes (Signed)
CSN: [id number]     Arrival date & time 10/29/14  1306 History   First MD Initiated Contact with Patient 10/29/14 1333     Chief Complaint  Patient presents with  . Medication Reaction   (Consider location/radiation/quality/duration/timing/severity/associated sxs/prior Treatment) HPI Comments: 44 year old male with a history of psychiatric illness to include schizophrenia and substance abuse presents to the urgent care stating he awoke this morning feeling shaky particularly in the extremities he also feels as though his flash get shaky his arms and legs. He denies being nervous or anxious. He denies being depressed. Scar medications include Depakote, Prolixin, Seroquel, citalopram, and Cogentin. He states that approximately 2-3 weeks ago his behavior list physician changed the frequency of his Prolixin from every 2 weeks to every 3 weeks. He is currently due to have an injection in 4 days. He has an appointment with Dr. Stephan Minister in 2 days.   Past Medical History  Diagnosis Date  . Psychiatric illness     Schitzophrenia  . Substance abuse    Past Surgical History  Procedure Laterality Date  . Dental surgery     Family History  Problem Relation Age of Onset  . Diabetes Mother   . Hypertension Mother    History  Substance Use Topics  . Smoking status: Current Every Day Smoker -- 0.50 packs/day  . Smokeless tobacco: Never Used  . Alcohol Use: Yes     Comment: Sober for 5 months    Review of Systems  Constitutional: Positive for fever and fatigue. Negative for activity change.  HENT: Negative.   Respiratory: Negative.  Negative for cough, choking, chest tightness and shortness of breath.   Cardiovascular: Negative for chest pain, palpitations and leg swelling.  Gastrointestinal: Negative.   Neurological: Positive for tremors. Negative for dizziness, seizures, syncope, facial asymmetry, speech difficulty, weakness, light-headedness, numbness and headaches.   Psychiatric/Behavioral: Negative for suicidal ideas, behavioral problems, self-injury and dysphoric mood. The patient is not nervous/anxious.     Allergies  Review of patient's allergies indicates no known allergies.  Home Medications   Prior to Admission medications   Medication Sig Start Date End Date Taking? Authorizing Provider  amoxicillin (AMOXIL) 500 MG capsule Take 1 capsule (500 mg total) by mouth 3 (three) times daily. 08/27/14   Rodolph Bong, MD  benztropine (COGENTIN) 1 MG tablet Take 1 mg by mouth daily.     Historical Provider, MD  citalopram (CELEXA) 20 MG tablet Take 20 mg by mouth daily.      Historical Provider, MD  diclofenac (VOLTAREN) 75 MG EC tablet Take 1 tablet (75 mg total) by mouth 2 (two) times daily as needed. 08/27/14   Rodolph Bong, MD  Divalproex Sodium (DEPAKOTE PO) Take 1 tablet by mouth at bedtime.    Historical Provider, MD  fluPHENAZine decanoate (PROLIXIN) 25 MG/ML injection Inject 12.5 mg into the muscle every 14 (fourteen) days.    Historical Provider, MD  methocarbamol (ROBAXIN) 500 MG tablet Take 1 tablet (500 mg total) by mouth 2 (two) times daily. 08/21/14   Fayrene Helper, PA-C  naproxen (NAPROSYN) 500 MG tablet Take 1 tablet (500 mg total) by mouth 2 (two) times daily. 08/21/14   Fayrene Helper, PA-C  QUEtiapine Fumarate (SEROQUEL PO) Take by mouth.    Historical Provider, MD   BP 122/76 mmHg  Pulse 61  Temp(Src) 98 F (36.7 C) (Oral)  Resp 16  SpO2 95% Physical Exam  Constitutional: He appears well-developed and well-nourished. No distress.  Pleasant 44 year old  male sitting on the exam table with an appearance of relaxed posturing. There are no evidence of tremors or psychomotor agitation. His speech is lucid and goal oriented. He denies auditory or visual hallucinations.  Eyes: Conjunctivae and EOM are normal.  Neck: Normal range of motion. Neck supple.  Cardiovascular: Normal rate, regular rhythm and normal heart sounds.   Pulmonary/Chest: Effort  normal and breath sounds normal. No respiratory distress. He has no wheezes.  Musculoskeletal: Normal range of motion. He exhibits no edema.  Lymphadenopathy:    He has no cervical adenopathy.  Neurological: He exhibits normal muscle tone.  Skin: Skin is warm and dry.  Psychiatric: He has a normal mood and affect. His speech is normal and behavior is normal. Thought content normal. His mood appears not anxious. He is not agitated, not aggressive, not hyperactive, not withdrawn, not actively hallucinating and not combative. Thought content is not delusional. Cognition and memory are not impaired. He expresses no homicidal and no suicidal ideation. He expresses no suicidal plans and no homicidal plans. He is attentive.  Nursing note and vitals reviewed.   ED Course  Procedures (including critical care time) Labs Review Labs Reviewed - No data to display  Imaging Review No results found.   MDM   1. Reaction, drug, adverse, initial encounter   2. Psychological disorder    Keep appt with your psych MD in 4 d Reassurance Pt is stable at this time No signs of withdrawal although there may be consideration of discontinuation syndrome since he missed a couple of days of his psychotropics.     Hayden Rasmussen, NP 10/29/14 1430  Hayden Rasmussen, NP 10/29/14 1430  Hayden Rasmussen, NP 10/29/14 1431

## 2014-11-02 DIAGNOSIS — F209 Schizophrenia, unspecified: Secondary | ICD-10-CM | POA: Diagnosis not present

## 2014-11-02 DIAGNOSIS — F251 Schizoaffective disorder, depressive type: Secondary | ICD-10-CM | POA: Diagnosis not present

## 2014-11-24 DIAGNOSIS — F251 Schizoaffective disorder, depressive type: Secondary | ICD-10-CM | POA: Diagnosis not present

## 2014-11-24 DIAGNOSIS — F209 Schizophrenia, unspecified: Secondary | ICD-10-CM | POA: Diagnosis not present

## 2014-12-15 DIAGNOSIS — F209 Schizophrenia, unspecified: Secondary | ICD-10-CM | POA: Diagnosis not present

## 2014-12-15 DIAGNOSIS — F251 Schizoaffective disorder, depressive type: Secondary | ICD-10-CM | POA: Diagnosis not present

## 2014-12-16 DIAGNOSIS — F209 Schizophrenia, unspecified: Secondary | ICD-10-CM | POA: Diagnosis not present

## 2014-12-16 DIAGNOSIS — F251 Schizoaffective disorder, depressive type: Secondary | ICD-10-CM | POA: Diagnosis not present

## 2014-12-30 DIAGNOSIS — F209 Schizophrenia, unspecified: Secondary | ICD-10-CM | POA: Diagnosis not present

## 2014-12-30 DIAGNOSIS — F251 Schizoaffective disorder, depressive type: Secondary | ICD-10-CM | POA: Diagnosis not present

## 2015-01-13 DIAGNOSIS — F209 Schizophrenia, unspecified: Secondary | ICD-10-CM | POA: Diagnosis not present

## 2015-01-13 DIAGNOSIS — F251 Schizoaffective disorder, depressive type: Secondary | ICD-10-CM | POA: Diagnosis not present

## 2015-01-26 DIAGNOSIS — F251 Schizoaffective disorder, depressive type: Secondary | ICD-10-CM | POA: Diagnosis not present

## 2015-01-27 DIAGNOSIS — F209 Schizophrenia, unspecified: Secondary | ICD-10-CM | POA: Diagnosis not present

## 2015-01-27 DIAGNOSIS — F251 Schizoaffective disorder, depressive type: Secondary | ICD-10-CM | POA: Diagnosis not present

## 2015-02-10 DIAGNOSIS — F209 Schizophrenia, unspecified: Secondary | ICD-10-CM | POA: Diagnosis not present

## 2015-02-10 DIAGNOSIS — F251 Schizoaffective disorder, depressive type: Secondary | ICD-10-CM | POA: Diagnosis not present

## 2015-02-24 DIAGNOSIS — F251 Schizoaffective disorder, depressive type: Secondary | ICD-10-CM | POA: Diagnosis not present

## 2015-02-24 DIAGNOSIS — F209 Schizophrenia, unspecified: Secondary | ICD-10-CM | POA: Diagnosis not present

## 2015-05-11 DIAGNOSIS — F251 Schizoaffective disorder, depressive type: Secondary | ICD-10-CM | POA: Diagnosis not present

## 2015-05-11 DIAGNOSIS — F209 Schizophrenia, unspecified: Secondary | ICD-10-CM | POA: Diagnosis not present

## 2015-05-25 DIAGNOSIS — F209 Schizophrenia, unspecified: Secondary | ICD-10-CM | POA: Diagnosis not present

## 2015-05-25 DIAGNOSIS — F251 Schizoaffective disorder, depressive type: Secondary | ICD-10-CM | POA: Diagnosis not present

## 2015-06-09 DIAGNOSIS — F251 Schizoaffective disorder, depressive type: Secondary | ICD-10-CM | POA: Diagnosis not present

## 2015-06-09 DIAGNOSIS — F209 Schizophrenia, unspecified: Secondary | ICD-10-CM | POA: Diagnosis not present

## 2015-08-16 DIAGNOSIS — R45851 Suicidal ideations: Secondary | ICD-10-CM | POA: Diagnosis not present

## 2015-08-16 DIAGNOSIS — R258 Other abnormal involuntary movements: Secondary | ICD-10-CM | POA: Diagnosis not present

## 2015-08-16 DIAGNOSIS — F29 Unspecified psychosis not due to a substance or known physiological condition: Secondary | ICD-10-CM | POA: Diagnosis not present

## 2015-08-24 DIAGNOSIS — L03115 Cellulitis of right lower limb: Secondary | ICD-10-CM | POA: Diagnosis not present

## 2015-08-26 DIAGNOSIS — F209 Schizophrenia, unspecified: Secondary | ICD-10-CM | POA: Diagnosis not present

## 2015-08-26 DIAGNOSIS — F17210 Nicotine dependence, cigarettes, uncomplicated: Secondary | ICD-10-CM | POA: Diagnosis not present

## 2015-08-26 DIAGNOSIS — Z885 Allergy status to narcotic agent status: Secondary | ICD-10-CM | POA: Diagnosis not present

## 2015-08-26 DIAGNOSIS — L03115 Cellulitis of right lower limb: Secondary | ICD-10-CM | POA: Diagnosis not present

## 2016-01-08 DIAGNOSIS — R220 Localized swelling, mass and lump, head: Secondary | ICD-10-CM | POA: Diagnosis not present

## 2016-01-08 DIAGNOSIS — S0266XA Fracture of symphysis of mandible, initial encounter for closed fracture: Secondary | ICD-10-CM | POA: Diagnosis not present

## 2016-01-08 DIAGNOSIS — S0269XA Fracture of mandible of other specified site, initial encounter for closed fracture: Secondary | ICD-10-CM | POA: Diagnosis not present

## 2016-01-10 DIAGNOSIS — R0602 Shortness of breath: Secondary | ICD-10-CM | POA: Diagnosis not present

## 2016-01-11 DIAGNOSIS — S02609A Fracture of mandible, unspecified, initial encounter for closed fracture: Secondary | ICD-10-CM | POA: Diagnosis not present

## 2016-01-11 DIAGNOSIS — S0266XA Fracture of symphysis of mandible, initial encounter for closed fracture: Secondary | ICD-10-CM | POA: Diagnosis not present

## 2016-01-11 DIAGNOSIS — S02602B Fracture of unspecified part of body of left mandible, initial encounter for open fracture: Secondary | ICD-10-CM | POA: Diagnosis not present

## 2016-01-11 DIAGNOSIS — S02601B Fracture of unspecified part of body of right mandible, initial encounter for open fracture: Secondary | ICD-10-CM | POA: Diagnosis not present

## 2016-01-12 DIAGNOSIS — R509 Fever, unspecified: Secondary | ICD-10-CM | POA: Diagnosis not present

## 2016-01-12 DIAGNOSIS — S02609A Fracture of mandible, unspecified, initial encounter for closed fracture: Secondary | ICD-10-CM | POA: Diagnosis not present

## 2016-01-23 DIAGNOSIS — S02602D Fracture of unspecified part of body of left mandible, subsequent encounter for fracture with routine healing: Secondary | ICD-10-CM | POA: Diagnosis not present

## 2016-01-28 DIAGNOSIS — F209 Schizophrenia, unspecified: Secondary | ICD-10-CM | POA: Diagnosis not present

## 2016-01-29 DIAGNOSIS — F209 Schizophrenia, unspecified: Secondary | ICD-10-CM | POA: Diagnosis not present

## 2016-01-30 DIAGNOSIS — F200 Paranoid schizophrenia: Secondary | ICD-10-CM | POA: Diagnosis not present

## 2016-03-27 DIAGNOSIS — F29 Unspecified psychosis not due to a substance or known physiological condition: Secondary | ICD-10-CM | POA: Diagnosis not present

## 2016-04-04 DIAGNOSIS — F250 Schizoaffective disorder, bipolar type: Secondary | ICD-10-CM | POA: Diagnosis not present

## 2016-04-04 DIAGNOSIS — F29 Unspecified psychosis not due to a substance or known physiological condition: Secondary | ICD-10-CM | POA: Diagnosis not present

## 2016-04-10 DIAGNOSIS — M545 Low back pain: Secondary | ICD-10-CM | POA: Diagnosis not present

## 2016-04-10 DIAGNOSIS — T184XXA Foreign body in colon, initial encounter: Secondary | ICD-10-CM | POA: Diagnosis not present

## 2016-04-13 DIAGNOSIS — F29 Unspecified psychosis not due to a substance or known physiological condition: Secondary | ICD-10-CM | POA: Diagnosis not present

## 2016-04-15 DIAGNOSIS — R1031 Right lower quadrant pain: Secondary | ICD-10-CM | POA: Diagnosis not present

## 2016-04-15 DIAGNOSIS — M25561 Pain in right knee: Secondary | ICD-10-CM | POA: Diagnosis not present

## 2016-04-15 DIAGNOSIS — R319 Hematuria, unspecified: Secondary | ICD-10-CM | POA: Diagnosis not present

## 2016-04-15 DIAGNOSIS — R110 Nausea: Secondary | ICD-10-CM | POA: Diagnosis not present

## 2016-04-15 DIAGNOSIS — M5432 Sciatica, left side: Secondary | ICD-10-CM | POA: Diagnosis not present

## 2016-04-15 DIAGNOSIS — M25562 Pain in left knee: Secondary | ICD-10-CM | POA: Diagnosis not present

## 2016-04-15 DIAGNOSIS — J439 Emphysema, unspecified: Secondary | ICD-10-CM | POA: Diagnosis not present

## 2016-04-27 DIAGNOSIS — F29 Unspecified psychosis not due to a substance or known physiological condition: Secondary | ICD-10-CM | POA: Diagnosis not present

## 2016-04-27 DIAGNOSIS — F250 Schizoaffective disorder, bipolar type: Secondary | ICD-10-CM | POA: Diagnosis not present

## 2016-05-02 DIAGNOSIS — F250 Schizoaffective disorder, bipolar type: Secondary | ICD-10-CM | POA: Diagnosis not present

## 2016-05-02 DIAGNOSIS — F29 Unspecified psychosis not due to a substance or known physiological condition: Secondary | ICD-10-CM | POA: Diagnosis not present

## 2016-05-02 DIAGNOSIS — Z79899 Other long term (current) drug therapy: Secondary | ICD-10-CM | POA: Diagnosis not present

## 2016-05-11 DIAGNOSIS — F29 Unspecified psychosis not due to a substance or known physiological condition: Secondary | ICD-10-CM | POA: Diagnosis not present

## 2016-05-11 DIAGNOSIS — F250 Schizoaffective disorder, bipolar type: Secondary | ICD-10-CM | POA: Diagnosis not present

## 2016-05-25 DIAGNOSIS — F250 Schizoaffective disorder, bipolar type: Secondary | ICD-10-CM | POA: Diagnosis not present

## 2016-05-25 DIAGNOSIS — F29 Unspecified psychosis not due to a substance or known physiological condition: Secondary | ICD-10-CM | POA: Diagnosis not present

## 2016-06-08 DIAGNOSIS — F29 Unspecified psychosis not due to a substance or known physiological condition: Secondary | ICD-10-CM | POA: Diagnosis not present

## 2016-06-08 DIAGNOSIS — F250 Schizoaffective disorder, bipolar type: Secondary | ICD-10-CM | POA: Diagnosis not present

## 2016-06-29 DIAGNOSIS — F250 Schizoaffective disorder, bipolar type: Secondary | ICD-10-CM | POA: Diagnosis not present

## 2016-08-28 DIAGNOSIS — T620X1A Toxic effect of ingested mushrooms, accidental (unintentional), initial encounter: Secondary | ICD-10-CM | POA: Diagnosis not present

## 2016-08-31 DIAGNOSIS — F250 Schizoaffective disorder, bipolar type: Secondary | ICD-10-CM | POA: Diagnosis not present

## 2016-08-31 DIAGNOSIS — Z79899 Other long term (current) drug therapy: Secondary | ICD-10-CM | POA: Diagnosis not present

## 2016-09-11 DIAGNOSIS — T50902A Poisoning by unspecified drugs, medicaments and biological substances, intentional self-harm, initial encounter: Secondary | ICD-10-CM | POA: Diagnosis not present

## 2016-09-11 DIAGNOSIS — R45851 Suicidal ideations: Secondary | ICD-10-CM | POA: Diagnosis not present

## 2016-09-11 DIAGNOSIS — R001 Bradycardia, unspecified: Secondary | ICD-10-CM | POA: Diagnosis not present

## 2016-09-11 DIAGNOSIS — Z915 Personal history of self-harm: Secondary | ICD-10-CM | POA: Diagnosis not present

## 2016-09-11 DIAGNOSIS — F209 Schizophrenia, unspecified: Secondary | ICD-10-CM | POA: Diagnosis not present

## 2016-09-11 DIAGNOSIS — T443X2A Poisoning by other parasympatholytics [anticholinergics and antimuscarinics] and spasmolytics, intentional self-harm, initial encounter: Secondary | ICD-10-CM | POA: Diagnosis not present

## 2016-09-26 DIAGNOSIS — F250 Schizoaffective disorder, bipolar type: Secondary | ICD-10-CM | POA: Diagnosis not present

## 2016-10-05 DIAGNOSIS — F250 Schizoaffective disorder, bipolar type: Secondary | ICD-10-CM | POA: Diagnosis not present

## 2016-11-16 DIAGNOSIS — F250 Schizoaffective disorder, bipolar type: Secondary | ICD-10-CM | POA: Diagnosis not present

## 2017-01-07 DIAGNOSIS — F250 Schizoaffective disorder, bipolar type: Secondary | ICD-10-CM | POA: Diagnosis not present

## 2017-02-01 DIAGNOSIS — F250 Schizoaffective disorder, bipolar type: Secondary | ICD-10-CM | POA: Diagnosis not present

## 2017-02-06 DIAGNOSIS — F250 Schizoaffective disorder, bipolar type: Secondary | ICD-10-CM | POA: Diagnosis not present

## 2017-02-08 DIAGNOSIS — F250 Schizoaffective disorder, bipolar type: Secondary | ICD-10-CM | POA: Diagnosis not present

## 2017-02-08 DIAGNOSIS — E162 Hypoglycemia, unspecified: Secondary | ICD-10-CM | POA: Diagnosis not present

## 2017-02-23 DIAGNOSIS — F200 Paranoid schizophrenia: Secondary | ICD-10-CM | POA: Diagnosis not present

## 2017-02-25 DIAGNOSIS — Z720 Tobacco use: Secondary | ICD-10-CM | POA: Diagnosis not present

## 2017-02-25 DIAGNOSIS — F319 Bipolar disorder, unspecified: Secondary | ICD-10-CM | POA: Diagnosis not present

## 2017-02-25 DIAGNOSIS — R45851 Suicidal ideations: Secondary | ICD-10-CM | POA: Diagnosis not present

## 2017-02-25 DIAGNOSIS — F329 Major depressive disorder, single episode, unspecified: Secondary | ICD-10-CM | POA: Diagnosis not present

## 2017-02-25 DIAGNOSIS — R440 Auditory hallucinations: Secondary | ICD-10-CM | POA: Diagnosis not present

## 2017-02-25 DIAGNOSIS — R32 Unspecified urinary incontinence: Secondary | ICD-10-CM | POA: Diagnosis not present

## 2017-02-26 DIAGNOSIS — F209 Schizophrenia, unspecified: Secondary | ICD-10-CM | POA: Diagnosis not present

## 2017-02-26 DIAGNOSIS — R45851 Suicidal ideations: Secondary | ICD-10-CM | POA: Diagnosis not present

## 2017-02-26 DIAGNOSIS — R32 Unspecified urinary incontinence: Secondary | ICD-10-CM | POA: Diagnosis not present

## 2017-02-26 DIAGNOSIS — Z720 Tobacco use: Secondary | ICD-10-CM | POA: Diagnosis not present

## 2017-02-27 DIAGNOSIS — R45850 Homicidal ideations: Secondary | ICD-10-CM | POA: Diagnosis not present

## 2017-02-27 DIAGNOSIS — R45851 Suicidal ideations: Secondary | ICD-10-CM | POA: Diagnosis not present

## 2017-02-27 DIAGNOSIS — F209 Schizophrenia, unspecified: Secondary | ICD-10-CM | POA: Diagnosis not present

## 2017-02-27 DIAGNOSIS — R32 Unspecified urinary incontinence: Secondary | ICD-10-CM | POA: Diagnosis not present

## 2017-02-28 DIAGNOSIS — R45851 Suicidal ideations: Secondary | ICD-10-CM | POA: Diagnosis not present

## 2017-02-28 DIAGNOSIS — F209 Schizophrenia, unspecified: Secondary | ICD-10-CM | POA: Diagnosis not present

## 2017-02-28 DIAGNOSIS — R45850 Homicidal ideations: Secondary | ICD-10-CM | POA: Diagnosis not present

## 2017-02-28 DIAGNOSIS — R32 Unspecified urinary incontinence: Secondary | ICD-10-CM | POA: Diagnosis not present

## 2017-03-01 DIAGNOSIS — R32 Unspecified urinary incontinence: Secondary | ICD-10-CM | POA: Diagnosis not present

## 2017-03-01 DIAGNOSIS — R45851 Suicidal ideations: Secondary | ICD-10-CM | POA: Diagnosis not present

## 2017-03-01 DIAGNOSIS — R45850 Homicidal ideations: Secondary | ICD-10-CM | POA: Diagnosis not present

## 2017-03-01 DIAGNOSIS — F209 Schizophrenia, unspecified: Secondary | ICD-10-CM | POA: Diagnosis not present

## 2017-03-02 DIAGNOSIS — R32 Unspecified urinary incontinence: Secondary | ICD-10-CM | POA: Diagnosis not present

## 2017-03-02 DIAGNOSIS — F209 Schizophrenia, unspecified: Secondary | ICD-10-CM | POA: Diagnosis not present

## 2017-03-02 DIAGNOSIS — Z6822 Body mass index (BMI) 22.0-22.9, adult: Secondary | ICD-10-CM | POA: Diagnosis not present

## 2017-03-02 DIAGNOSIS — Z720 Tobacco use: Secondary | ICD-10-CM | POA: Diagnosis not present

## 2017-03-03 DIAGNOSIS — R440 Auditory hallucinations: Secondary | ICD-10-CM | POA: Diagnosis not present

## 2017-03-03 DIAGNOSIS — R32 Unspecified urinary incontinence: Secondary | ICD-10-CM | POA: Diagnosis not present

## 2017-03-03 DIAGNOSIS — Z6822 Body mass index (BMI) 22.0-22.9, adult: Secondary | ICD-10-CM | POA: Diagnosis not present

## 2017-03-03 DIAGNOSIS — F209 Schizophrenia, unspecified: Secondary | ICD-10-CM | POA: Diagnosis not present

## 2017-03-04 DIAGNOSIS — Z720 Tobacco use: Secondary | ICD-10-CM | POA: Diagnosis not present

## 2017-03-04 DIAGNOSIS — F209 Schizophrenia, unspecified: Secondary | ICD-10-CM | POA: Diagnosis not present

## 2017-03-04 DIAGNOSIS — Z6822 Body mass index (BMI) 22.0-22.9, adult: Secondary | ICD-10-CM | POA: Diagnosis not present

## 2017-03-04 DIAGNOSIS — R32 Unspecified urinary incontinence: Secondary | ICD-10-CM | POA: Diagnosis not present

## 2017-03-05 DIAGNOSIS — R45851 Suicidal ideations: Secondary | ICD-10-CM | POA: Diagnosis not present

## 2017-03-05 DIAGNOSIS — Z590 Homelessness: Secondary | ICD-10-CM | POA: Diagnosis not present

## 2017-03-05 DIAGNOSIS — R32 Unspecified urinary incontinence: Secondary | ICD-10-CM | POA: Diagnosis not present

## 2017-03-05 DIAGNOSIS — Z720 Tobacco use: Secondary | ICD-10-CM | POA: Diagnosis not present

## 2017-03-11 DIAGNOSIS — F250 Schizoaffective disorder, bipolar type: Secondary | ICD-10-CM | POA: Diagnosis not present

## 2017-05-07 DIAGNOSIS — K047 Periapical abscess without sinus: Secondary | ICD-10-CM | POA: Diagnosis not present

## 2017-05-07 DIAGNOSIS — R6884 Jaw pain: Secondary | ICD-10-CM | POA: Diagnosis not present

## 2017-05-08 DIAGNOSIS — R6884 Jaw pain: Secondary | ICD-10-CM | POA: Diagnosis not present

## 2017-05-08 DIAGNOSIS — K047 Periapical abscess without sinus: Secondary | ICD-10-CM | POA: Diagnosis not present

## 2017-05-11 IMAGING — DX DG CHEST 2V
2 series · 2 of 2 positions shown · non-contrast
Comparison: None.

CLINICAL DATA: Acute chest pain.

EXAM:
CHEST  2 VIEW

[chest pa]
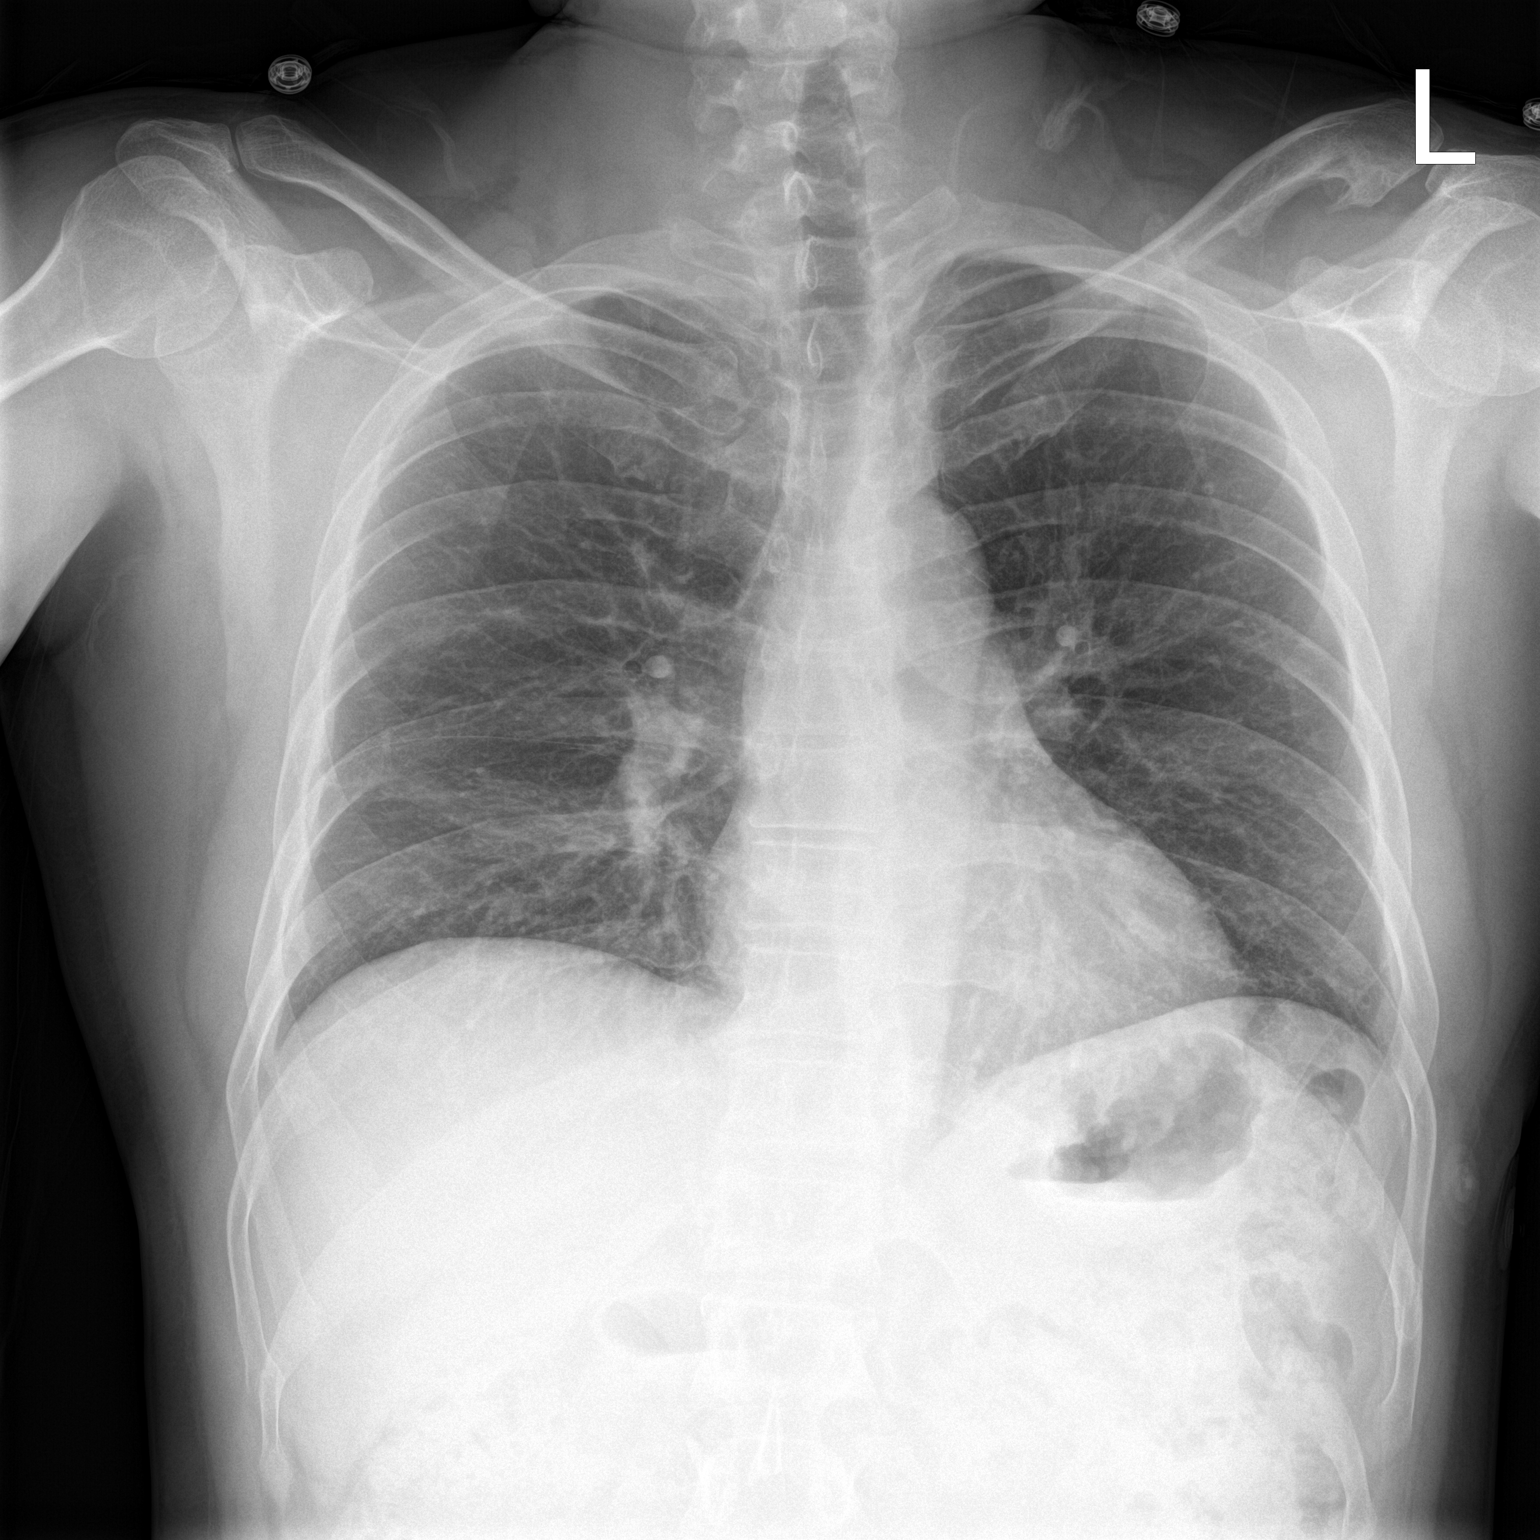

[chest lat]
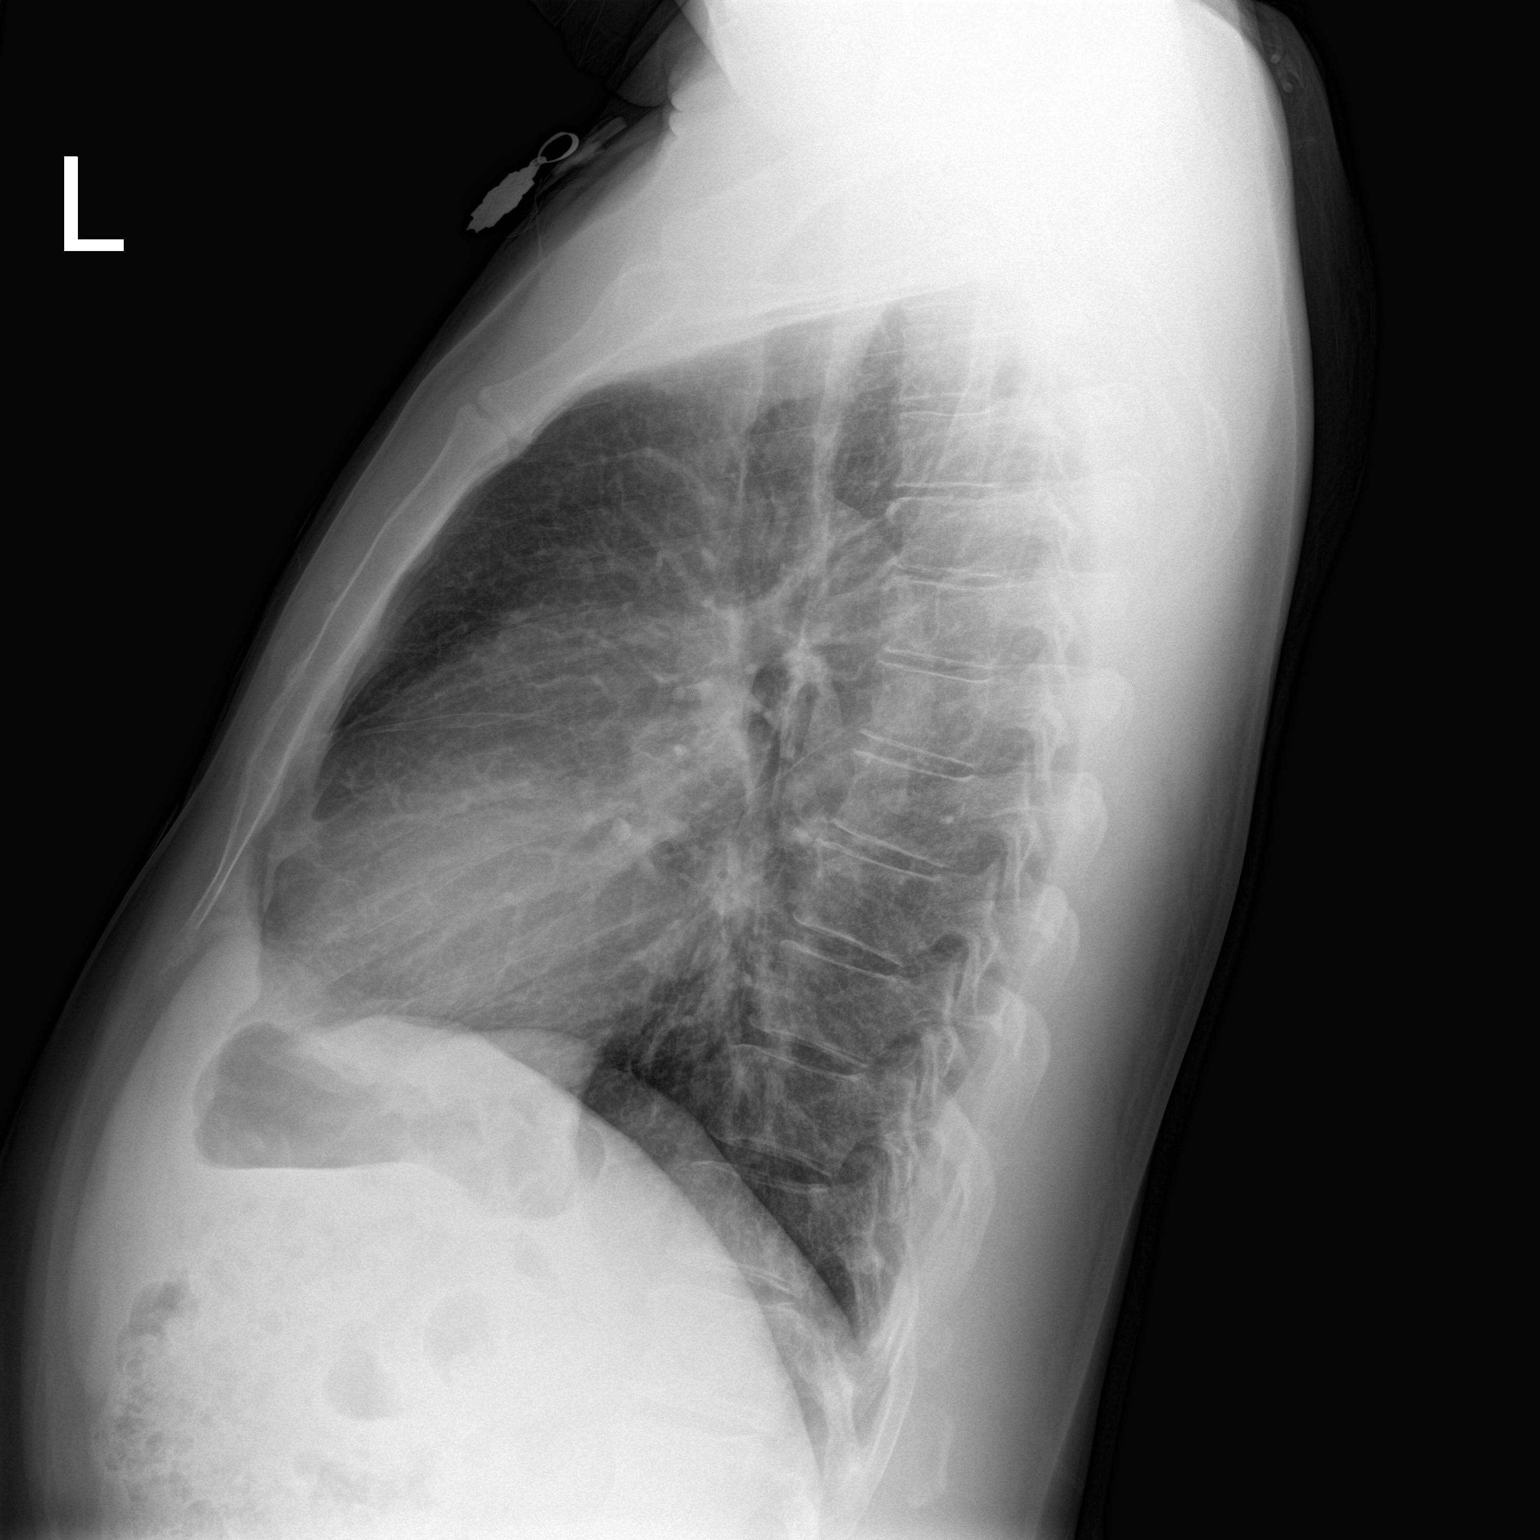

[2 of 2 positions shown; findings below may reference images not displayed]

FINDINGS: The heart size and mediastinal contours are within normal limits.
Both lungs are clear. No pneumothorax or pleural effusion is noted.
The visualized skeletal structures are unremarkable.
IMPRESSION: No active cardiopulmonary disease.

## 2017-07-29 DIAGNOSIS — F250 Schizoaffective disorder, bipolar type: Secondary | ICD-10-CM | POA: Diagnosis not present

## 2017-07-30 DIAGNOSIS — F250 Schizoaffective disorder, bipolar type: Secondary | ICD-10-CM | POA: Diagnosis not present

## 2017-08-07 DIAGNOSIS — F250 Schizoaffective disorder, bipolar type: Secondary | ICD-10-CM | POA: Diagnosis not present

## 2017-09-04 DIAGNOSIS — F250 Schizoaffective disorder, bipolar type: Secondary | ICD-10-CM | POA: Diagnosis not present

## 2017-11-11 DIAGNOSIS — F250 Schizoaffective disorder, bipolar type: Secondary | ICD-10-CM | POA: Diagnosis not present

## 2017-11-20 DIAGNOSIS — F250 Schizoaffective disorder, bipolar type: Secondary | ICD-10-CM | POA: Diagnosis not present

## 2017-11-22 DIAGNOSIS — F250 Schizoaffective disorder, bipolar type: Secondary | ICD-10-CM | POA: Diagnosis not present

## 2017-11-23 DIAGNOSIS — F200 Paranoid schizophrenia: Secondary | ICD-10-CM | POA: Diagnosis not present

## 2017-11-24 DIAGNOSIS — F250 Schizoaffective disorder, bipolar type: Secondary | ICD-10-CM | POA: Diagnosis not present

## 2017-11-25 DIAGNOSIS — F250 Schizoaffective disorder, bipolar type: Secondary | ICD-10-CM | POA: Diagnosis not present

## 2017-11-26 DIAGNOSIS — F250 Schizoaffective disorder, bipolar type: Secondary | ICD-10-CM | POA: Diagnosis not present

## 2017-11-27 DIAGNOSIS — F250 Schizoaffective disorder, bipolar type: Secondary | ICD-10-CM | POA: Diagnosis not present

## 2017-11-28 DIAGNOSIS — F250 Schizoaffective disorder, bipolar type: Secondary | ICD-10-CM | POA: Diagnosis not present

## 2017-11-29 DIAGNOSIS — F250 Schizoaffective disorder, bipolar type: Secondary | ICD-10-CM | POA: Diagnosis not present

## 2017-11-30 DIAGNOSIS — F250 Schizoaffective disorder, bipolar type: Secondary | ICD-10-CM | POA: Diagnosis not present

## 2017-12-01 DIAGNOSIS — F250 Schizoaffective disorder, bipolar type: Secondary | ICD-10-CM | POA: Diagnosis not present

## 2017-12-02 DIAGNOSIS — F250 Schizoaffective disorder, bipolar type: Secondary | ICD-10-CM | POA: Diagnosis not present

## 2017-12-03 DIAGNOSIS — F250 Schizoaffective disorder, bipolar type: Secondary | ICD-10-CM | POA: Diagnosis not present

## 2017-12-04 DIAGNOSIS — F250 Schizoaffective disorder, bipolar type: Secondary | ICD-10-CM | POA: Diagnosis not present

## 2017-12-05 DIAGNOSIS — F250 Schizoaffective disorder, bipolar type: Secondary | ICD-10-CM | POA: Diagnosis not present

## 2017-12-06 DIAGNOSIS — F250 Schizoaffective disorder, bipolar type: Secondary | ICD-10-CM | POA: Diagnosis not present

## 2017-12-07 DIAGNOSIS — F250 Schizoaffective disorder, bipolar type: Secondary | ICD-10-CM | POA: Diagnosis not present

## 2017-12-08 DIAGNOSIS — F250 Schizoaffective disorder, bipolar type: Secondary | ICD-10-CM | POA: Diagnosis not present

## 2017-12-09 DIAGNOSIS — F250 Schizoaffective disorder, bipolar type: Secondary | ICD-10-CM | POA: Diagnosis not present

## 2017-12-10 DIAGNOSIS — F250 Schizoaffective disorder, bipolar type: Secondary | ICD-10-CM | POA: Diagnosis not present

## 2017-12-16 DIAGNOSIS — F250 Schizoaffective disorder, bipolar type: Secondary | ICD-10-CM | POA: Diagnosis not present

## 2018-01-08 DIAGNOSIS — F250 Schizoaffective disorder, bipolar type: Secondary | ICD-10-CM | POA: Diagnosis not present

## 2018-01-10 DIAGNOSIS — F250 Schizoaffective disorder, bipolar type: Secondary | ICD-10-CM | POA: Diagnosis not present

## 2018-01-31 DIAGNOSIS — F250 Schizoaffective disorder, bipolar type: Secondary | ICD-10-CM | POA: Diagnosis not present

## 2018-02-21 DIAGNOSIS — F250 Schizoaffective disorder, bipolar type: Secondary | ICD-10-CM | POA: Diagnosis not present

## 2018-02-24 DIAGNOSIS — F250 Schizoaffective disorder, bipolar type: Secondary | ICD-10-CM | POA: Diagnosis not present

## 2018-03-04 DIAGNOSIS — D649 Anemia, unspecified: Secondary | ICD-10-CM | POA: Diagnosis not present

## 2018-03-04 DIAGNOSIS — F17210 Nicotine dependence, cigarettes, uncomplicated: Secondary | ICD-10-CM | POA: Diagnosis not present

## 2018-03-04 DIAGNOSIS — F209 Schizophrenia, unspecified: Secondary | ICD-10-CM | POA: Diagnosis not present

## 2018-03-04 DIAGNOSIS — T43592A Poisoning by other antipsychotics and neuroleptics, intentional self-harm, initial encounter: Secondary | ICD-10-CM | POA: Diagnosis not present

## 2018-03-04 DIAGNOSIS — T43502A Poisoning by unspecified antipsychotics and neuroleptics, intentional self-harm, initial encounter: Secondary | ICD-10-CM | POA: Diagnosis not present

## 2018-03-04 DIAGNOSIS — M6282 Rhabdomyolysis: Secondary | ICD-10-CM | POA: Diagnosis not present

## 2018-03-04 DIAGNOSIS — Z79899 Other long term (current) drug therapy: Secondary | ICD-10-CM | POA: Diagnosis not present

## 2018-03-05 DIAGNOSIS — F250 Schizoaffective disorder, bipolar type: Secondary | ICD-10-CM | POA: Diagnosis not present

## 2018-03-12 DIAGNOSIS — F250 Schizoaffective disorder, bipolar type: Secondary | ICD-10-CM | POA: Diagnosis not present

## 2018-03-14 DIAGNOSIS — F250 Schizoaffective disorder, bipolar type: Secondary | ICD-10-CM | POA: Diagnosis not present

## 2018-04-04 DIAGNOSIS — F250 Schizoaffective disorder, bipolar type: Secondary | ICD-10-CM | POA: Diagnosis not present
